# Patient Record
Sex: Male | Born: 1980 | Race: White | Hispanic: No | Marital: Married | Smoking: Current every day smoker
Health system: Southern US, Community
[De-identification: ages and names within clinical notes are randomized; demographics above are authoritative.]

## PROBLEM LIST (undated history)

## (undated) HISTORY — PX: KNEE SURGERY: SHX244

---
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
## 2001-11-01 ENCOUNTER — Encounter: Admission: RE | Admit: 2001-11-01 | Discharge: 2001-11-01 | Payer: Self-pay | Admitting: Family Medicine

## 2001-11-01 ENCOUNTER — Encounter: Payer: Self-pay | Admitting: Family Medicine

## 2003-11-08 ENCOUNTER — Emergency Department (HOSPITAL_COMMUNITY): Admission: EM | Admit: 2003-11-08 | Discharge: 2003-11-08 | Payer: Self-pay | Admitting: Emergency Medicine

## 2006-05-25 ENCOUNTER — Encounter: Admission: RE | Admit: 2006-05-25 | Discharge: 2006-05-25 | Payer: Self-pay | Admitting: Family Medicine

## 2007-05-06 IMAGING — CR DG CERVICAL SPINE COMPLETE 4+V
7 series · 7 of 7 positions shown · non-contrast
Comparison: none

CLINICAL DATA: Neck pain.
 CERVICAL SPINE ? 7 VIEWS:

[w c-spine lat]
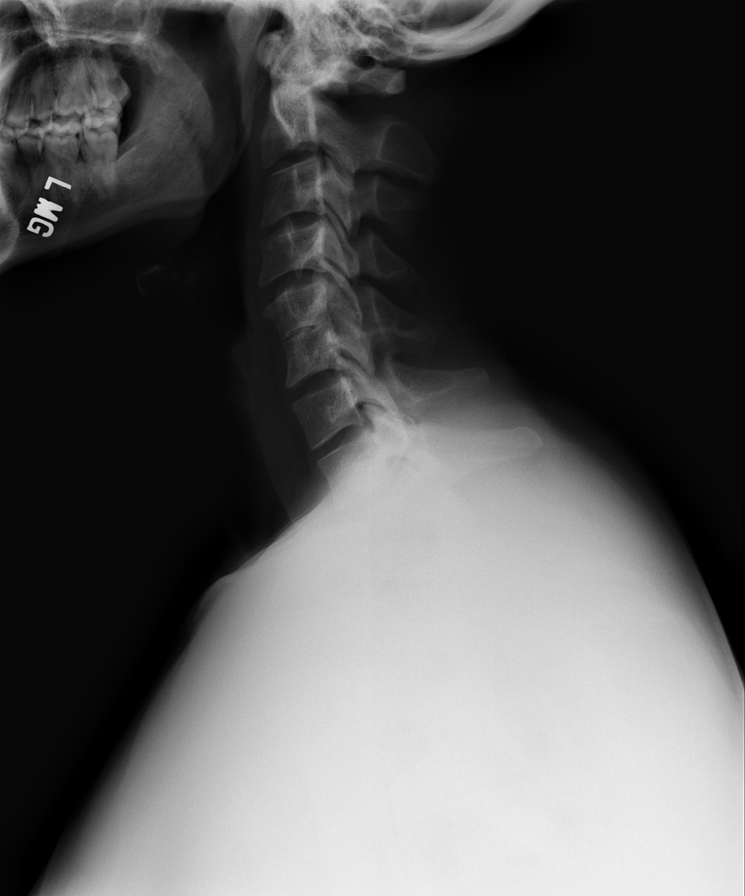

[w c-spine oblique (1 of 2)]
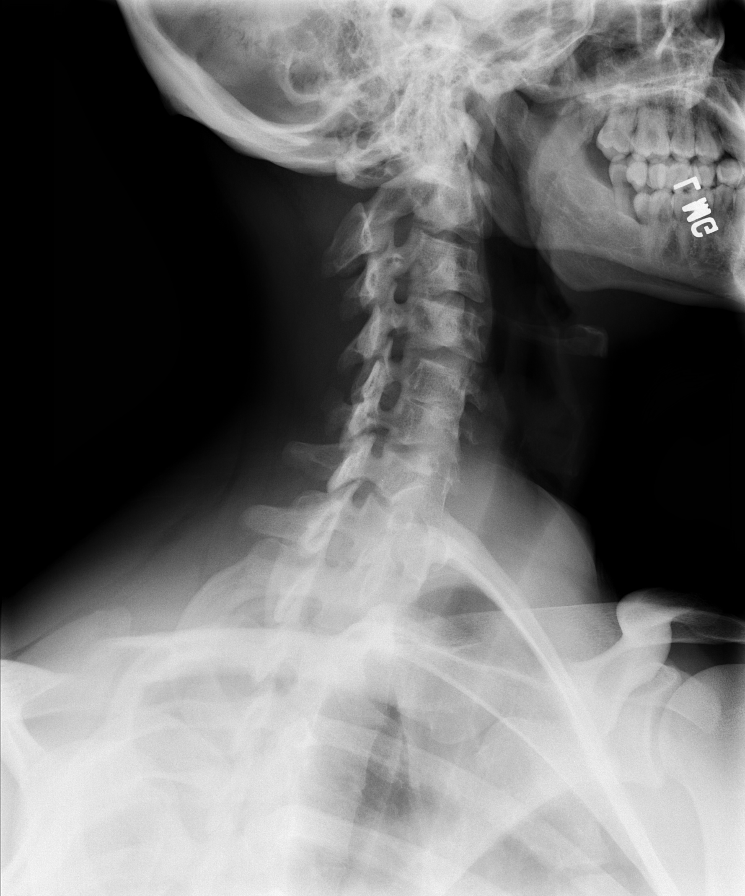

[w c-spine oblique (2 of 2)]
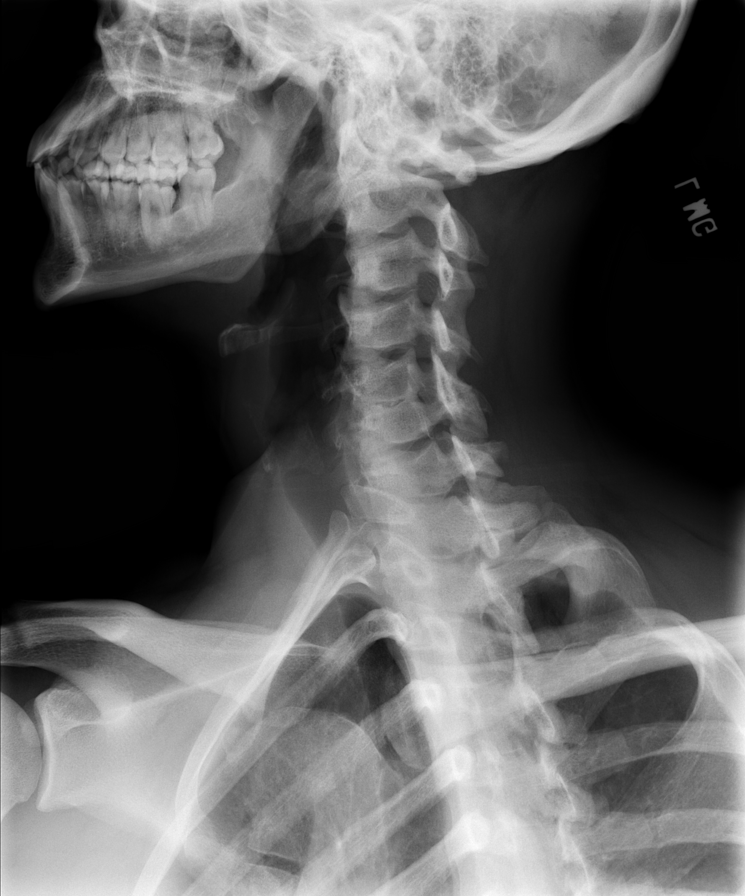

[w c-spine a.p. *]
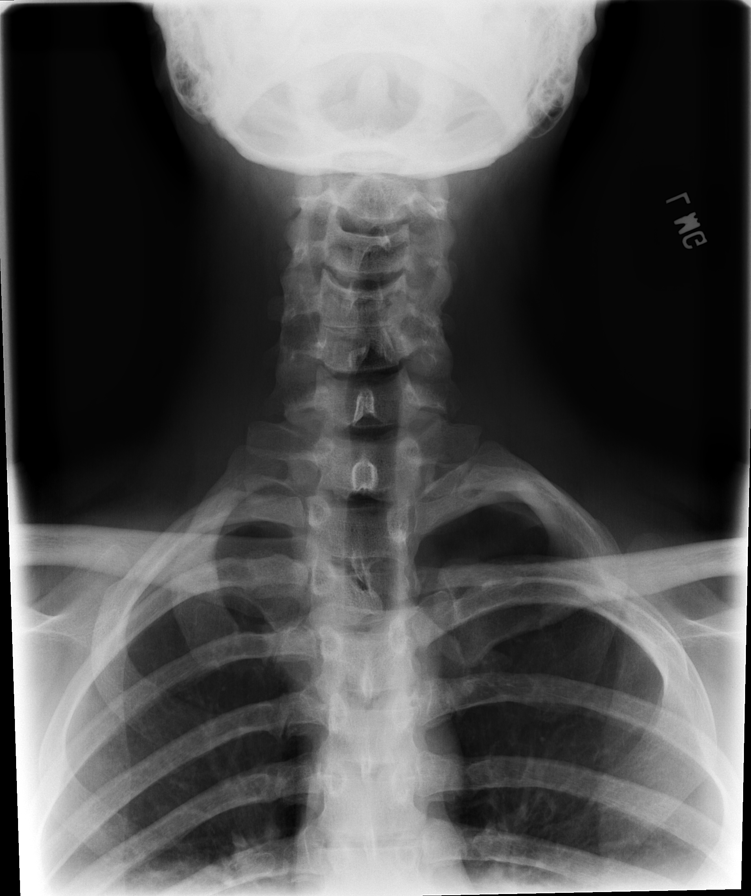

[w c-spine odontoid *]
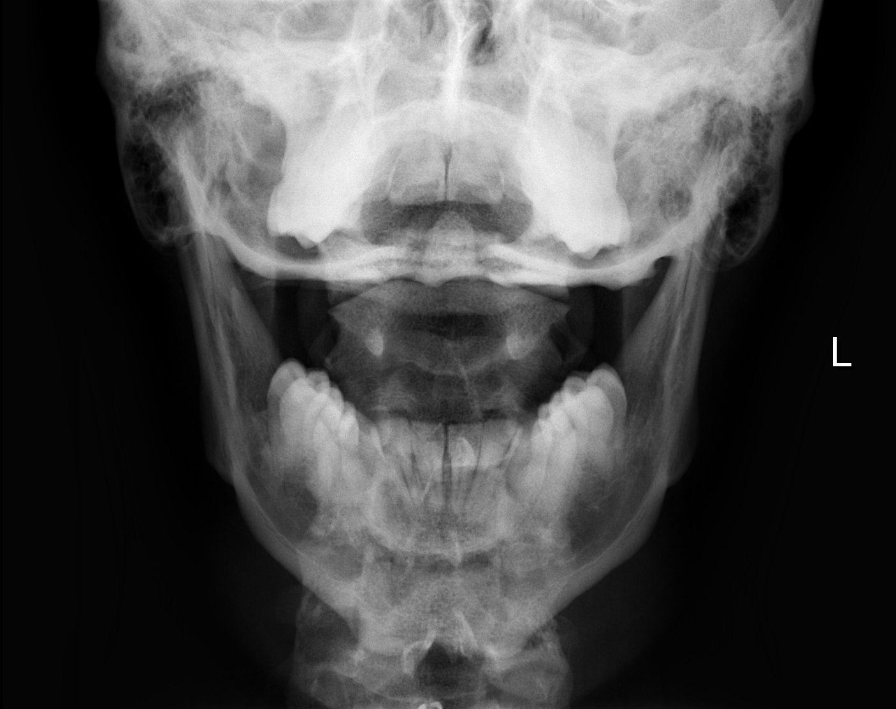

[w reverse waters *]
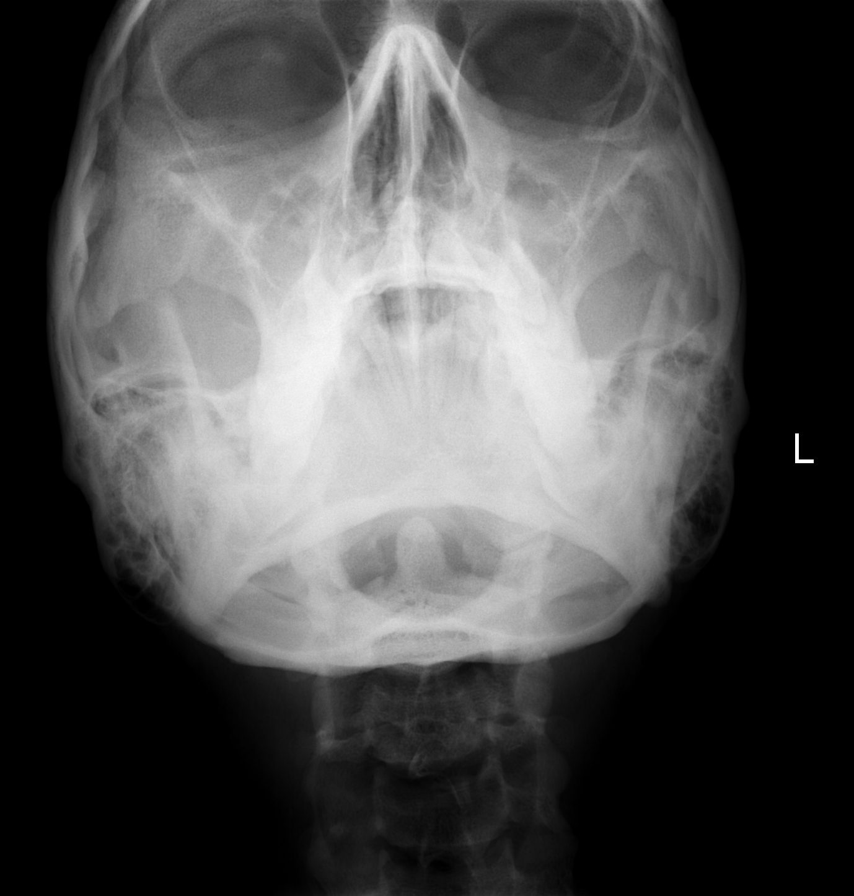

[w swimmers view *]
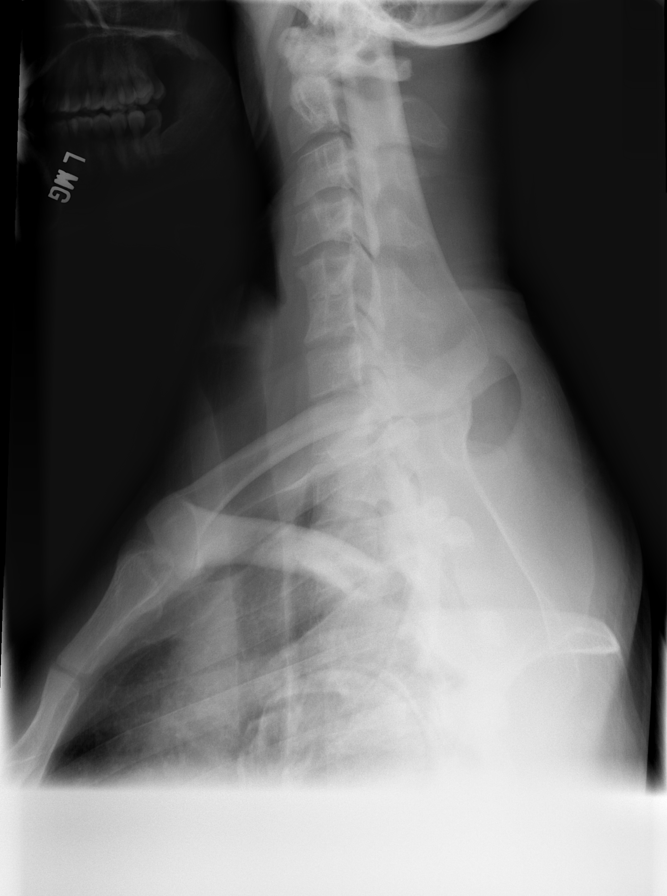

[7 of 7 positions shown; findings below may reference images not displayed]

FINDINGS: There is congenital fusion at the C5-6 level.  The vertebrae at C5 and C6 are somewhat diminutive.  Vertebral body height is maintained.  Vertebral body alignment is anatomical.  Prevertebral soft tissues are unremarkable.
IMPRESSION: Congenital fusion of C5-6.  The examination is otherwise negative.

## 2013-06-25 ENCOUNTER — Encounter (HOSPITAL_COMMUNITY): Payer: Self-pay | Admitting: Emergency Medicine

## 2013-06-25 ENCOUNTER — Emergency Department (HOSPITAL_COMMUNITY)
Admission: EM | Admit: 2013-06-25 | Discharge: 2013-06-25 | Disposition: A | Discharge status: Home / Self Care | Payer: BC Managed Care – PPO | Source: Home / Self Care

## 2013-06-25 DIAGNOSIS — M545 Low back pain: Secondary | ICD-10-CM

## 2013-06-25 DIAGNOSIS — IMO0002 Reserved for concepts with insufficient information to code with codable children: Secondary | ICD-10-CM

## 2013-06-25 DIAGNOSIS — M549 Dorsalgia, unspecified: Secondary | ICD-10-CM

## 2013-06-25 DIAGNOSIS — S39012A Strain of muscle, fascia and tendon of lower back, initial encounter: Secondary | ICD-10-CM

## 2013-06-25 DIAGNOSIS — M5459 Other low back pain: Secondary | ICD-10-CM

## 2013-06-25 DIAGNOSIS — M5380 Other specified dorsopathies, site unspecified: Secondary | ICD-10-CM

## 2013-06-25 MED ORDER — CYCLOBENZAPRINE HCL 10 MG PO TABS: 10.0000 mg | ORAL_TABLET | Freq: Two times a day (BID) | ORAL | Status: DC | PRN

## 2013-06-25 MED ORDER — KETOROLAC TROMETHAMINE 60 MG/2ML IM SOLN
60.0000 mg | Freq: Once | INTRAMUSCULAR | Status: AC
  Administered 2013-06-25: 60 mg via INTRAMUSCULAR

## 2013-06-25 MED ORDER — KETOROLAC TROMETHAMINE 60 MG/2ML IM SOLN
INTRAMUSCULAR | Status: AC
  Filled 2013-06-25: qty 2

## 2013-06-25 MED ORDER — MELOXICAM 15 MG PO TABS: 15.0000 mg | ORAL_TABLET | Freq: Every day | ORAL | Status: DC

## 2013-06-25 MED ORDER — TRAMADOL HCL 50 MG PO TABS: 50.0000 mg | ORAL_TABLET | Freq: Four times a day (QID) | ORAL | Status: DC | PRN

## 2013-06-25 NOTE — ED Provider Notes (Signed)
CSN: [id number]     Arrival date & time 06/25/13  1352 History   None    Chief Complaint  Patient presents with  . Back Pain   (Consider location/radiation/quality/duration/timing/severity/associated sxs/prior Treatment) HPI Comments: Patient reports a 3 day history of right lower back pain. He was lifting a carpet roll and felt a "twinge". Pain now with ROM and bending and lifting. No radicular pain, numbness or weakness. No loss of bowel or bladder control.   Patient is a 33 y.o. male presenting with back pain. The history is provided by the patient.  Back Pain   History reviewed. No pertinent past medical history. No past surgical history on file. No family history on file. History  Substance Use Topics  . Smoking status: Not on file  . Smokeless tobacco: Not on file  . Alcohol Use: Not on file    Review of Systems  Musculoskeletal: Positive for back pain.  All other systems reviewed and are negative.    Allergies  Review of patient's allergies indicates no known allergies.  Home Medications   Current Outpatient Rx  Name  Route  Sig  Dispense  Refill  . cyclobenzaprine (FLEXERIL) 10 MG tablet   Oral   Take 1 tablet (10 mg total) by mouth 2 (two) times daily as needed for muscle spasms.   20 tablet   0   . meloxicam (MOBIC) 15 MG tablet   Oral   Take 1 tablet (15 mg total) by mouth daily.   30 tablet   0   . traMADol (ULTRAM) 50 MG tablet   Oral   Take 1 tablet (50 mg total) by mouth every 6 (six) hours as needed.   15 tablet   0    BP 132/87  Pulse 69  Temp(Src) 98.3 F (36.8 C) (Oral)  Resp 18  SpO2 98% Physical Exam  Nursing note and vitals reviewed. Constitutional: He is oriented to person, place, and time. He appears well-developed and well-nourished. No distress.  Pulmonary/Chest: Effort normal.  Musculoskeletal: He exhibits no edema and no tenderness.  Pain with ROM flexion and extension along the right lower side. No pain with palpation,.    Neurological: He is alert and oriented to person, place, and time. He has normal reflexes. No cranial nerve deficit. Coordination normal.  Sensation intact to bilateral lower extremities, Motor strength 5/5  Skin: Skin is warm and dry. He is not diaphoretic.  Psychiatric: His behavior is normal.    ED Course  Procedures (including critical care time) Labs Review Labs Reviewed - No data to display Imaging Review No results found.  EKG Interpretation    Date/Time:    Ventricular Rate:    PR Interval:    QRS Duration:   QT Interval:    QTC Calculation:   R Axis:     Text Interpretation:              MDM   1. Back pain   2. Back sprain or strain, initial encounter   3. Lumbar facet joint pain   Toradol here given discomfort. NSAIDs, Muscle Relaxer and Pain med's as needed. Rest and work note. F/U with ORtho if no improvement.     Riki SheerMichelle G Young, PA-C 06/25/13 1601

## 2013-06-25 NOTE — Discharge Instructions (Signed)
Back Exercises °Back exercises help treat and prevent back injuries. The goal of back exercises is to increase the strength of your abdominal and back muscles and the flexibility of your back. These exercises should be started when you no longer have back pain. Back exercises include: °· Pelvic Tilt. Lie on your back with your knees bent. Tilt your pelvis until the lower part of your back is against the floor. Hold this position 5 to 10 sec and repeat 5 to 10 times. °· Knee to Chest. Pull first 1 knee up against your chest and hold for 20 to 30 seconds, repeat this with the other knee, and then both knees. This may be done with the other leg straight or bent, whichever feels better. °· Sit-Ups or Curl-Ups. Bend your knees 90 degrees. Start with tilting your pelvis, and do a partial, slow sit-up, lifting your trunk only 30 to 45 degrees off the floor. Take at least 2 to 3 seconds for each sit-up. Do not do sit-ups with your knees out straight. If partial sit-ups are difficult, simply do the above but with only tightening your abdominal muscles and holding it as directed. °· Hip-Lift. Lie on your back with your knees flexed 90 degrees. Push down with your feet and shoulders as you raise your hips a couple inches off the floor; hold for 10 seconds, repeat 5 to 10 times. °· Back arches. Lie on your stomach, propping yourself up on bent elbows. Slowly press on your hands, causing an arch in your low back. Repeat 3 to 5 times. Any initial stiffness and discomfort should lessen with repetition over time. °· Shoulder-Lifts. Lie face down with arms beside your body. Keep hips and torso pressed to floor as you slowly lift your head and shoulders off the floor. °Do not overdo your exercises, especially in the beginning. Exercises may cause you some mild back discomfort which lasts for a few minutes; however, if the pain is more severe, or lasts for more than 15 minutes, do not continue exercises until you see your caregiver.  Improvement with exercise therapy for back problems is slow.  °See your caregivers for assistance with developing a proper back exercise program. °Document Released: 07/16/2004 Document Revised: 08/31/2011 Document Reviewed: 04/09/2011 °Exit[employer]® Patient Information ©2014 [employer]. ° °Back Pain, Adult °Back pain is very common. The pain often gets better over time. The cause of back pain is usually not dangerous. Most people can learn to manage their back pain on their own.  °HOME CARE  °· Stay active. Start with short walks on flat ground if you can. Try to walk farther each day. °· Do not sit, drive, or stand in one place for more than 30 minutes. Do not stay in bed. °· Do not avoid exercise or work. Activity can help your back heal faster. °· Be careful when you bend or lift an object. Bend at your knees, keep the object close to you, and do not twist. °· Sleep on a firm mattress. Lie on your side, and bend your knees. If you lie on your back, put a pillow under your knees. °· Only take medicines as told by your doctor. °· Put ice on the injured area. °· Put ice in a plastic bag. °· Place a towel between your skin and the bag. °· Leave the ice on for 15-20 minutes, 03-04 times a day for the first 2 to 3 days. After that, you can switch between ice and heat packs. °· Ask your   doctor about back exercises or massage.  Avoid feeling anxious or stressed. Find good ways to deal with stress, such as exercise. GET HELP RIGHT AWAY IF:   Your pain does not go away with rest or medicine.  Your pain does not go away in 1 week.  You have new problems.  You do not feel well.  The pain spreads into your legs.  You cannot control when you poop (bowel movement) or pee (urinate).  Your arms or legs feel weak or lose feeling (numbness).  You feel sick to your stomach (nauseous) or throw up (vomit).  You have belly (abdominal) pain.  You feel like you may pass out (faint). MAKE SURE YOU:   Understand  these instructions.  Will watch your condition.  Will get help right away if you are not doing well or get worse. Document Released: 11/25/2007 Document Revised: 08/31/2011 Document Reviewed: 10/27/2010 [facility] Patient Information 2014 [employer], [place].    Ice alternating heat to low back. Avoid lying on soft sofa or chair. Lie on mattress or floor. No lifting over 25lbs until well, then start exercises once well to prevent further injury. Use Mobic daily for inflammation. Muscle relaxer's and pain medication as needed. Call for ortho appt if does not improve.

## 2013-06-25 NOTE — ED Notes (Signed)
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

## 2013-06-26 NOTE — ED Provider Notes (Signed)
Medical screening examination/treatment/procedure(s) were performed by a resident physician or non-physician practitioner and as the supervising physician I was immediately available for consultation/collaboration.  Clementeen GrahamEvan Shirle Provencal, MD    Rodolph BongEvan S Alejandrina Raimer, MD 06/26/13 [phone]

## 2014-01-09 ENCOUNTER — Emergency Department (HOSPITAL_COMMUNITY)
Admission: EM | Admit: 2014-01-09 | Discharge: 2014-01-09 | Disposition: A | Discharge status: Home / Self Care | Payer: BC Managed Care – PPO | Source: Home / Self Care | Attending: Family Medicine | Admitting: Family Medicine

## 2014-01-09 ENCOUNTER — Encounter (HOSPITAL_COMMUNITY): Payer: Self-pay | Admitting: Emergency Medicine

## 2014-01-09 ENCOUNTER — Emergency Department (INDEPENDENT_AMBULATORY_CARE_PROVIDER_SITE_OTHER): Payer: BC Managed Care – PPO

## 2014-01-09 DIAGNOSIS — S93609A Unspecified sprain of unspecified foot, initial encounter: Secondary | ICD-10-CM

## 2014-01-09 DIAGNOSIS — R296 Repeated falls: Secondary | ICD-10-CM

## 2014-01-09 DIAGNOSIS — S93601A Unspecified sprain of right foot, initial encounter: Secondary | ICD-10-CM

## 2014-01-09 NOTE — ED Notes (Signed)
MEDIUM  WOODEN  SHOE  AND  ACE    WRAP  TO  R   FOOT

## 2014-01-09 NOTE — ED Provider Notes (Signed)
CSN: [id number]     Arrival date & time 01/09/14  0908 History   First MD Initiated Contact with Patient 01/09/14 0920     Chief Complaint  Patient presents with  . Foot Pain   (Consider location/radiation/quality/duration/timing/severity/associated sxs/prior Treatment) Patient is a 33 y.o. male presenting with lower extremity pain. The history is provided by the patient.  Foot Pain This is a new problem. The current episode started yesterday (fell 8-10 feet from attic ladder that broke--injury to right arm repaired and right foot discovered later after boot removed.-pain and swelling.). The problem has been gradually worsening. The symptoms are aggravated by walking.    History reviewed. No pertinent past medical history. History reviewed. No pertinent past surgical history. History reviewed. No pertinent family history. History  Substance Use Topics  . Smoking status: Not on file  . Smokeless tobacco: Not on file  . Alcohol Use: No    Review of Systems  Constitutional: Negative.   Musculoskeletal: Positive for gait problem and joint swelling.  Skin: Negative.     Allergies  Review of patient's allergies indicates no known allergies.  Home Medications   Prior to Admission medications   Medication Sig Start Date End Date Taking? Authorizing Provider  cyclobenzaprine (FLEXERIL) 10 MG tablet Take 1 tablet (10 mg total) by mouth 2 (two) times daily as needed for muscle spasms. 06/25/13   Riki SheerMichelle G Young, PA-C  meloxicam (MOBIC) 15 MG tablet Take 1 tablet (15 mg total) by mouth daily. 06/25/13   Riki SheerMichelle G Young, PA-C  traMADol (ULTRAM) 50 MG tablet Take 1 tablet (50 mg total) by mouth every 6 (six) hours as needed. 06/25/13   Riki SheerMichelle G Young, PA-C   BP 153/82  Pulse 58  Temp(Src) 98.5 F (36.9 C) (Oral)  Resp 12  SpO2 98% Physical Exam  Nursing note and vitals reviewed. Constitutional: He is oriented to person, place, and time. He appears well-developed and well-nourished.    Musculoskeletal: He exhibits tenderness.       Feet:  Neurological: He is alert and oriented to person, place, and time.  Skin: Skin is warm and dry.    ED Course  Procedures (including critical care time) Labs Review Labs Reviewed - No data to display  Imaging Review No results found.   MDM  No diagnosis found.     Linna HoffJames D Kindl, MD 01/09/14 1031

## 2014-01-09 NOTE — Discharge Instructions (Signed)
Wear ace and shoe for comfort as needed, activity as tolerated, see orthopedist if further problems.

## 2014-01-09 NOTE — ED Notes (Signed)
Pt  Fell  yest  And  Injured his  r  Foot    /  Pain and  Swelling  To  The  Foot  Present         Pt  Was  Seen  yest  In [place]    For  A  Laceration  which they  Repaired     He  Has  A  Splint  In place        With  Ace bandage     Present  He  States  They  Did not  Check  His  Foot    As  He  Had  On a  workboot and  It  Was  Not  Bothering          Him  Then

## 2015-02-12 ENCOUNTER — Ambulatory Visit (INDEPENDENT_AMBULATORY_CARE_PROVIDER_SITE_OTHER): Payer: BLUE CROSS/BLUE SHIELD

## 2015-02-12 ENCOUNTER — Ambulatory Visit (INDEPENDENT_AMBULATORY_CARE_PROVIDER_SITE_OTHER): Payer: BLUE CROSS/BLUE SHIELD | Admitting: Podiatry

## 2015-02-12 VITALS — BP 120/74 | HR 65 | Resp 16

## 2015-02-12 DIAGNOSIS — M79672 Pain in left foot: Secondary | ICD-10-CM | POA: Diagnosis not present

## 2015-02-12 DIAGNOSIS — M722 Plantar fascial fibromatosis: Secondary | ICD-10-CM | POA: Diagnosis not present

## 2015-02-12 DIAGNOSIS — Z0189 Encounter for other specified special examinations: Secondary | ICD-10-CM | POA: Diagnosis not present

## 2015-02-12 MED ORDER — TRIAMCINOLONE ACETONIDE 10 MG/ML IJ SUSP
10.0000 mg | Freq: Once | INTRAMUSCULAR | Status: AC
  Administered 2015-02-12: 10 mg

## 2015-02-12 MED ORDER — DICLOFENAC SODIUM 75 MG PO TBEC: 75.0000 mg | DELAYED_RELEASE_TABLET | Freq: Two times a day (BID) | ORAL | Status: DC

## 2015-02-12 NOTE — Patient Instructions (Signed)

## 2015-02-12 NOTE — Progress Notes (Signed)
   Subjective:    Patient ID: Rodney Walter, male    DOB: 05/25/1981, 34 y.o.   MRN: [id number]  HPI Pt presents with left heel pain, ongoing for several months, worse in the morning. Has tried nsaids and ice   Review of Systems  All other systems reviewed and are negative.      Objective:   Physical Exam        Assessment & Plan:

## 2015-02-15 NOTE — Progress Notes (Signed)
Subjective:     Patient ID: Rodney Walter, male   DOB: Mar 22, 1981, 34 y.o.   MRN: [id number]  HPI patient presents with severe heel pain left at the insertional point of the tendon into the calcaneus with inflammation and fluid around the medial band at its point of insertion. He states that it's been hurting him now for about 6 months and worse over the last couple months   Review of Systems  All other systems reviewed and are negative.      Objective:   Physical Exam  Constitutional: He is oriented to person, place, and time.  Cardiovascular: Intact distal pulses.   Musculoskeletal: Normal range of motion.  Neurological: He is oriented to person, place, and time.  Skin: Skin is warm.  Nursing note and vitals reviewed.  neurovascular status intact with muscle strength adequate range of motion within normal limits. Patient's noted to have exquisite tenderness in the left plantar fascia at the insertional point of the tendon into the calcaneus with inflammation and fluid around the medial band and also moderate depression of the arch. Patient is young for this condition so I'm concerned about spur formation     Assessment:     Acute plantar fasciitis left with chronic nature also to the condition    Plan:     H&P and x-ray reviewed with patient and I have recommended at this time injection therapy which was administered with 3 mg Kenalog 5 mg Xylocaine and applied fascial brace with instructions on usage. Gave instructions on physical therapy and placed on diclofenac 75 mg twice a day and reappoint to recheck again in 1 week spurring on x-ray and reviewed his right x-ray indicating spur present

## 2015-02-20 ENCOUNTER — Ambulatory Visit (INDEPENDENT_AMBULATORY_CARE_PROVIDER_SITE_OTHER): Payer: BLUE CROSS/BLUE SHIELD | Admitting: Podiatry

## 2015-02-20 ENCOUNTER — Encounter: Payer: Self-pay | Admitting: Podiatry

## 2015-02-20 VITALS — BP 121/72 | HR 64 | Resp 16

## 2015-02-20 DIAGNOSIS — M722 Plantar fascial fibromatosis: Secondary | ICD-10-CM

## 2015-02-20 NOTE — Progress Notes (Signed)
Subjective:     Patient ID: Rodney Walter, male   DOB: [birth year], 34 y.o.   MRN: [id number]  HPI patient states my heel is doing much better with minimal discomfort when palpated and I'm able to walk without pain but I will be able to test at this weekend   Review of Systems     Objective:   Physical Exam  neurovascular status intact muscle strength adequate range of motion within normal limits with patient noted to have significant reduction of discomfort plantar aspect left heel    Assessment:      plantar fasciitis left heel that's doing well with minimal discomfort upon palpation    Plan:      instructed on physical therapy and I inflammatory and at this time I went ahead and I advised on possible orthotics long-term or other treatment if symptoms were to recur or persist. Asians discharge

## 2015-07-03 ENCOUNTER — Encounter: Payer: Self-pay | Admitting: Podiatry

## 2015-07-03 ENCOUNTER — Ambulatory Visit (INDEPENDENT_AMBULATORY_CARE_PROVIDER_SITE_OTHER): Payer: BLUE CROSS/BLUE SHIELD | Admitting: Podiatry

## 2015-07-03 DIAGNOSIS — M722 Plantar fascial fibromatosis: Secondary | ICD-10-CM

## 2015-07-03 DIAGNOSIS — M79672 Pain in left foot: Secondary | ICD-10-CM

## 2015-07-03 MED ORDER — TRIAMCINOLONE ACETONIDE 10 MG/ML IJ SUSP
10.0000 mg | Freq: Once | INTRAMUSCULAR | Status: AC
  Administered 2015-07-03: 10 mg

## 2015-07-05 NOTE — Progress Notes (Signed)
Subjective:     Patient ID: Rodney Walter, male   DOB: 05/31/1981, 35 y.o.   MRN: [id number]  HPI patient states I'm still having a lot of problems with his left heel and it's been ongoing   Review of Systems     Objective:   Physical Exam Neurovascular status intact with continued exquisite discomfort left plantar fascia at the insertion tendon calcaneus with fluid buildup noted around the medial band    Assessment:     Planter fasciitis left still present    Plan:     Advised on long-term orthotics and scanned for custom orthotic devices and injected the plantar fascial left 3 [place] Kenalog 5 mill grams Xylocaine and advised on physical therapy

## 2015-07-24 ENCOUNTER — Ambulatory Visit: Payer: BLUE CROSS/BLUE SHIELD | Admitting: *Deleted

## 2015-07-24 DIAGNOSIS — M722 Plantar fascial fibromatosis: Secondary | ICD-10-CM

## 2015-07-24 NOTE — Patient Instructions (Signed)

## 2015-07-24 NOTE — Progress Notes (Signed)
Patient ID: Rodney Walter, male   DOB: [birth year], 35 y.o.   MRN: [id number] Patient presents for orthotic pick up.  Verbal and written break in and wear instructions given.  Patient will follow up in 4 weeks if symptoms worsen or fail to improve.

## 2020-03-07 ENCOUNTER — Other Ambulatory Visit: Payer: Self-pay

## 2020-03-07 ENCOUNTER — Ambulatory Visit
Admission: EM | Admit: 2020-03-07 | Discharge: 2020-03-07 | Disposition: A | Discharge status: Home / Self Care | Payer: 59 | Attending: Emergency Medicine | Admitting: Emergency Medicine

## 2020-03-07 ENCOUNTER — Encounter: Payer: Self-pay | Admitting: Emergency Medicine

## 2020-03-07 DIAGNOSIS — Z1152 Encounter for screening for COVID-19: Secondary | ICD-10-CM | POA: Diagnosis not present

## 2020-03-07 DIAGNOSIS — R52 Pain, unspecified: Secondary | ICD-10-CM | POA: Diagnosis not present

## 2020-03-07 DIAGNOSIS — R509 Fever, unspecified: Secondary | ICD-10-CM | POA: Diagnosis not present

## 2020-03-07 MED ORDER — IBUPROFEN 800 MG PO TABS
800.0000 mg | ORAL_TABLET | Freq: Once | ORAL | Status: AC
  Administered 2020-03-07: 800 mg via ORAL

## 2020-03-07 NOTE — ED Triage Notes (Signed)
Fever, body aches, chills, headache and back ache that started this morning.

## 2020-03-07 NOTE — ED Provider Notes (Addendum)
[facility]   [id number] 03/07/20 Arrival Time: 1935   CC: COVID symptoms  SUBJECTIVE: History from: patient and family.  Rodney Walter is a 39 y.o. male who  presents to the urgent care for complaint of chills, fever, headache, body aches that started this a.m.  Denies sick exposure to COVID, flu or strep.  Denies recent travel.  Has tried OTC medication without relief.  Denies aggravating factor.  Denies previous symptoms in the past.   Denies fever, chills, fatigue, sinus pain, rhinorrhea, sore throat, SOB, wheezing, chest pain, nausea, changes in bowel or bladder habits.      ROS: As per HPI.  All other pertinent ROS negative.     History reviewed. No pertinent past medical history. Past Surgical History:  Procedure Laterality Date  . KNEE SURGERY Right    No Known Allergies No current facility-administered medications on file prior to encounter.   Current Outpatient Medications on File Prior to Encounter  Medication Sig Dispense Refill  . cyclobenzaprine (FLEXERIL) 10 MG tablet Take 1 tablet (10 mg total) by mouth 2 (two) times daily as needed for muscle spasms. 20 tablet 0  . diclofenac (VOLTAREN) 75 MG EC tablet Take 1 tablet (75 mg total) by mouth 2 (two) times daily. 50 tablet 2   Social History   Socioeconomic History  . Marital status: Single    Spouse name: Not on file  . Number of children: Not on file  . Years of education: Not on file  . Highest education level: Not on file  Occupational History  . Not on file  Tobacco Use  . Smoking status: Current Some Day Smoker    Types: Cigarettes  . Smokeless tobacco: Never Used  Substance and Sexual Activity  . Alcohol use: No    Alcohol/week: 0.0 standard drinks  . Drug use: Yes    Types: Marijuana    Comment: occ  . Sexual activity: Not on file  Other Topics Concern  . Not on file  Social History Narrative  . Not on file   Social Determinants of Health   Financial Resource Strain:   .  Difficulty of Paying Living Expenses: Not on file  Food Insecurity:   . Worried About Programme researcher, broadcasting/film/video in the Last Year: Not on file  . Ran Out of Food in the Last Year: Not on file  Transportation Needs:   . Lack of Transportation (Medical): Not on file  . Lack of Transportation (Non-Medical): Not on file  Physical Activity:   . Days of Exercise per Week: Not on file  . Minutes of Exercise per Session: Not on file  Stress:   . Feeling of Stress : Not on file  Social Connections:   . Frequency of Communication with Friends and Family: Not on file  . Frequency of Social Gatherings with Friends and Family: Not on file  . Attends Religious Services: Not on file  . Active Member of Clubs or Organizations: Not on file  . Attends Banker Meetings: Not on file  . Marital Status: Not on file  Intimate Partner Violence:   . Fear of Current or Ex-Partner: Not on file  . Emotionally Abused: Not on file  . Physically Abused: Not on file  . Sexually Abused: Not on file   No family history on file.  OBJECTIVE:  Vitals:   03/07/20 1948 03/07/20 1949  BP: 126/78   Pulse: 72   Resp: 19   Temp: 100.3  F (37.9 C)   TempSrc: Oral   SpO2: 95%   Weight:  235 lb (106.6 kg)  Height:  5\' 9"  (1.753 m)     General appearance: alert; appears fatigued, but nontoxic; speaking in full sentences and tolerating own secretions HEENT: NCAT; Ears: EACs clear, TMs pearly gray; Eyes: PERRL.  EOM grossly intact. Sinuses: nontender; Nose: nares patent without rhinorrhea, Throat: oropharynx clear, tonsils non erythematous or enlarged, uvula midline  Neck: supple without LAD Lungs: unlabored respirations, symmetrical air entry; cough: absent; no respiratory distress; CTAB Heart: regular rate and rhythm.  Radial pulses 2+ symmetrical bilaterally Skin: warm and dry Psychological: alert and cooperative; normal mood and affect  LABS:  No results found for this or any previous visit (from the  past 24 hour(s)).   ASSESSMENT & PLAN:  1. Fever, unspecified   2. Encounter for screening for COVID-19   3. Body aches     Meds ordered this encounter  Medications  . ibuprofen (ADVIL) tablet 800 mg    Discharge instructions  COVID testing ordered.  It will take between 5-7 days for test results.  Someone will contact you regarding abnormal results.    In the meantime: You should remain isolated in your home for 10 days from symptom onset AND greater than 72 hours after symptoms resolution (absence of fever without the use of fever-reducing medication and improvement in respiratory symptoms), whichever is longer Get plenty of rest and push fluids Use OTC medications like ibuprofen or tylenol as needed fever or pain Call or go to the ED if you have any new or worsening symptoms such as fever, worsening cough, shortness of breath, chest tightness, chest pain, turning blue, changes in mental status, etc...   Reviewed expectations re: course of current medical issues. Questions answered. Outlined signs and symptoms indicating need for more acute intervention. Patient verbalized understanding. After Visit Summary given.         , FNP 03/07/20 2000    03/09/20, FNP 03/07/20 2000

## 2020-03-07 NOTE — Discharge Instructions (Addendum)
COVID testing ordered.  It will take between 5-7 days for test results.  Someone will contact you regarding abnormal results.   ° °In the meantime: °You should remain isolated in your home for 10 days from symptom onset AND greater than 72 hours after symptoms resolution (absence of fever without the use of fever-reducing medication and improvement in respiratory symptoms), whichever is longer °Get plenty of rest and push fluids °Use OTC medications like ibuprofen or tylenol as needed fever or pain °Call or go to the ED if you have any new or worsening symptoms such as fever, worsening cough, shortness of breath, chest tightness, chest pain, turning blue, changes in mental status, etc...  °

## 2020-03-09 LAB — SARS-COV-2, NAA 2 DAY TAT

## 2020-03-09 LAB — NOVEL CORONAVIRUS, NAA: SARS-CoV-2, NAA: DETECTED — AB

## 2022-08-06 ENCOUNTER — Ambulatory Visit: Payer: 59 | Admitting: Cardiology

## 2022-08-06 ENCOUNTER — Encounter: Payer: Self-pay | Admitting: Cardiology

## 2022-08-06 VITALS — BP 122/69 | HR 57 | Resp 16 | Ht 69.0 in | Wt 227.0 lb

## 2022-08-06 DIAGNOSIS — R072 Precordial pain: Secondary | ICD-10-CM

## 2022-08-06 DIAGNOSIS — R55 Syncope and collapse: Secondary | ICD-10-CM

## 2022-08-06 NOTE — Progress Notes (Signed)
Patient referred by Alroy Dust, L.Marlou Sa, MD for precordial pain, near syncope  Subjective:   Rodney Walter, male    DOB: 04/29/1981, 42 y.o.   MRN: [id number]   Chief Complaint  Patient presents with   Chest Pain   Bradycardia   New Patient (Initial Visit)    HPI  42 y.o. Caucasian male with precordial pain, near syncope  Patient works in [employer], is a smoker.  He was having a typical morning, where he was driving to work at [id number] in the morning.  He typically does not eat anything until no time.  On the way to work, he suddenly felt numbness in his entire body, followed by precordial pain and arm pain.  Entire episode lasted for 15-20 seconds.  He pulled over his car to the shoulder.  He never lost consciousness.  He noticed that he accidentally had a loss of bladder control.  He denies any shaking or jerking motion, denies any tongue bite.  He also denies any headache.  Within a minute or so, patient was back to his normal self.  He was able to drive to urgent care himself, from where he was sent for an urgent evaluation.   History reviewed. No pertinent past medical history.   Past Surgical History:  Procedure Laterality Date   KNEE SURGERY Right      Social History   Tobacco Use  Smoking Status Some Days   Types: Cigarettes  Smokeless Tobacco Never    Social History   Substance and Sexual Activity  Alcohol Use No   Alcohol/week: 0.0 standard drinks of alcohol     Family History  Problem Relation Age of Onset   Diabetes Father    Heart disease Maternal Grandfather    Heart disease Paternal Grandfather       Current Outpatient Medications:    cyclobenzaprine (FLEXERIL) 10 MG tablet, Take 1 tablet (10 mg total) by mouth 2 (two) times daily as needed for muscle spasms., Disp: 20 tablet, Rfl: 0   diclofenac (VOLTAREN) 75 MG EC tablet, Take 1 tablet (75 mg total) by mouth 2 (two) times daily., Disp: 50 tablet, Rfl: 2   Cardiovascular and other pertinent  studies:  Reviewed external labs and tests, independently interpreted  EKG 08/06/2022: Sinus rhythm 59 bpm Possible old anteroseptal infarct No acute ischemic changes   Recent labs: Not available   Review of Systems  Cardiovascular:  Positive for chest pain. Negative for dyspnea on exertion, leg swelling, palpitations and syncope.  Neurological:  Positive for paresthesias.         Vitals:   08/06/22 1321  BP: 122/69  Pulse: (!) 57  Resp: 16  SpO2: 100%     Body mass index is 33.52 kg/m. Filed Weights   08/06/22 1321  Weight: 227 lb (103 kg)     Objective:   Physical Exam Vitals and nursing note reviewed.  Constitutional:      General: He is not in acute distress. Neck:     Vascular: No JVD.  Cardiovascular:     Rate and Rhythm: Normal rate and regular rhythm.     Heart sounds: Normal heart sounds. No murmur heard. Pulmonary:     Effort: Pulmonary effort is normal.     Breath sounds: Normal breath sounds. No wheezing or rales.  Musculoskeletal:     Right lower leg: No edema.     Left lower leg: No edema.  Visit diagnoses:   ICD-10-CM   1. Precordial pain  R07.2 EKG 12-Lead    PCV ECHOCARDIOGRAM COMPLETE    PCV CARDIAC STRESS TEST    CBC    Basic metabolic panel    Troponin T    D-Dimer, Quantitative    Lipid panel    2. Near syncope  R55        Orders Placed This Encounter  Procedures   CBC   Basic metabolic panel   Troponin T   D-Dimer, Quantitative   Lipid panel   PCV CARDIAC STRESS TEST   EKG 12-Lead   PCV ECHOCARDIOGRAM COMPLETE      Assessment & Recommendations:    42 y.o. Caucasian male with precordial pain, near syncope  Chest pain is unlikely to be angina given the very short duration of his symptoms.  He did not have complete loss of consciousness.  The above episode most likely was a vasovagal episode, etiology remains unclear.  While he did have bladder control loss, there is no any other suggestion to  suggest seizure activity.  He is back to his baseline self.  EKG does not show any acute ischemic changes. He is not bradycardic. Vitals are completely normal.  I will check stat D-dimer and troponin, as well as CBC, BMP, lipid panel.  I will obtain exercise treadmill stress test and echocardiogram.  Monitor is unlikely to be of high yield given the occasional nature of his symptoms.  Further recommendations after above testing.  Thank you for referring the patient to [place]. Please feel free to contact with any questions.   Nigel Mormon, MD Pager: [phone] Office: [phone]
